# Patient Record
Sex: Female | Born: 1995 | Hispanic: Yes | Marital: Single | Smoking: Former smoker
Health system: Southern US, Community
[De-identification: ages and names within clinical notes are randomized; demographics above are authoritative.]

---

## 2015-04-13 ENCOUNTER — Encounter: Payer: Self-pay | Admitting: Emergency Medicine

## 2015-04-13 ENCOUNTER — Emergency Department: Payer: Medicaid Other

## 2015-04-13 ENCOUNTER — Emergency Department
Admission: EM | Admit: 2015-04-13 | Discharge: 2015-04-13 | Disposition: A | Discharge status: Home / Self Care | Payer: Medicaid Other | Attending: Emergency Medicine | Admitting: Emergency Medicine

## 2015-04-13 DIAGNOSIS — Z87891 Personal history of nicotine dependence: Secondary | ICD-10-CM | POA: Insufficient documentation

## 2015-04-13 DIAGNOSIS — Z880 Allergy status to penicillin: Secondary | ICD-10-CM | POA: Diagnosis not present

## 2015-04-13 DIAGNOSIS — J209 Acute bronchitis, unspecified: Secondary | ICD-10-CM

## 2015-04-13 DIAGNOSIS — R05 Cough: Secondary | ICD-10-CM | POA: Diagnosis present

## 2015-04-13 MED ORDER — PREDNISONE 10 MG PO TABS: 50.0000 mg | ORAL_TABLET | Freq: Every day | ORAL | Status: AC

## 2015-04-13 MED ORDER — ALBUTEROL SULFATE (2.5 MG/3ML) 0.083% IN NEBU
2.5000 mg | INHALATION_SOLUTION | Freq: Once | RESPIRATORY_TRACT | Status: AC
  Administered 2015-04-13: 2.5 mg via RESPIRATORY_TRACT
  Filled 2015-04-13: qty 3

## 2015-04-13 MED ORDER — ALBUTEROL SULFATE HFA 108 (90 BASE) MCG/ACT IN AERS: 2.0000 | INHALATION_SPRAY | Freq: Four times a day (QID) | RESPIRATORY_TRACT | Status: AC | PRN

## 2015-04-13 MED ORDER — GUAIFENESIN-CODEINE 100-10 MG/5ML PO SOLN: 10.0000 mL | Freq: Three times a day (TID) | ORAL | Status: AC | PRN

## 2015-04-13 MED ORDER — IPRATROPIUM-ALBUTEROL 0.5-2.5 (3) MG/3ML IN SOLN
3.0000 mL | Freq: Once | RESPIRATORY_TRACT | Status: AC
  Administered 2015-04-13: 3 mL via RESPIRATORY_TRACT

## 2015-04-13 MED ORDER — IPRATROPIUM-ALBUTEROL 0.5-2.5 (3) MG/3ML IN SOLN
RESPIRATORY_TRACT | Status: AC
  Filled 2015-04-13: qty 3

## 2015-04-13 NOTE — ED Provider Notes (Signed)
[facility] Emergency Department Provider Note ____________________________________________  Time seen: Approximately 12:17 PM  I have reviewed the triage vital signs and the nursing notes.   HISTORY  Chief Complaint Cough   HPI Valerie Joseph is a 19 y.o. female who presents to the emergency department for evaluation of cough x 4 days. She states that she has been wheezing and had chest congestion as well. No known fever. No OTC meds relieve symptoms.  History reviewed. No pertinent past medical history.  There are no active problems to display for this patient.   History reviewed. No pertinent past surgical history.  Current Outpatient Rx  Name  Route  Sig  Dispense  Refill  . albuterol (PROVENTIL HFA;VENTOLIN HFA) 108 (90 BASE) MCG/ACT inhaler   Inhalation   Inhale 2 puffs into the lungs every 6 (six) hours as needed for wheezing or shortness of breath.   1 Inhaler   2   . guaiFENesin-codeine 100-10 MG/5ML syrup   Oral   Take 10 mLs by mouth 3 (three) times daily as needed.   120 mL   0   . predniSONE (DELTASONE) 10 MG tablet   Oral   Take 5 tablets (50 mg total) by mouth daily.   25 tablet   0     Allergies Penicillins  No family history on file.  Social History Social History  Substance Use Topics  . Smoking status: Former Games developer  . Smokeless tobacco: None  . Alcohol Use: No    Review of Systems Constitutional: No fever/chills Eyes: No visual changes. ENT: No sore throat. Cardiovascular: Denies chest pain. Respiratory: Positive for shortness of breath. Positive for cough. Gastrointestinal: Negative for abdominal pain. Negative for nausea,  Negative for vomiting.  Negative for diarrhea.  Genitourinary: Negative for dysuria. Musculoskeletal: Negative for body aches Skin: Negative for rash. Neurological: Negative for headaches, Negative for focal weakness or numbness.  10-point ROS otherwise  negative.  ____________________________________________   PHYSICAL EXAM:  VITAL SIGNS: ED Triage Vitals  Enc Vitals Group     BP 04/13/15 1154 135/75 mmHg     Pulse Rate 04/13/15 1154 96     Resp 04/13/15 1154 20     Temp 04/13/15 1154 97.7 F (36.5 C)     Temp Source 04/13/15 1154 Oral     SpO2 04/13/15 1154 96 %     Weight 04/13/15 1154 265 lb (120.203 kg)     Height 04/13/15 1154  (1.575 m)     Head Cir --      Peak Flow --      Pain Score 04/13/15 1155 7     Pain Loc --      Pain Edu? --      Excl. in GC? --     Constitutional: Alert and oriented. Well appearing and in no acute distress. Eyes: Conjunctivae are normal. PERRL. EOMI. Ears: Normal TM Head: Atraumatic. Nose: No congestion/rhinnorhea. Mouth/Throat: Mucous membranes are moist.  Oropharynx non-erythematous. Neck: No stridor.  Lymphatic: No cervical lymphadenopathy. Cardiovascular: Normal rate, regular rhythm. Grossly normal heart sounds.  Good peripheral circulation. Respiratory: Normal respiratory effort.  No retractions. Expiratory wheeze throughout. Gastrointestinal: Soft and nontender. No distention. No abdominal bruits. No CVA tenderness. Musculoskeletal: No joint pain reported. Neurologic:  Normal speech and language. No gross focal neurologic deficits are appreciated. Speech is normal. No gait instability. Skin:  Skin is warm, dry and intact. No rash noted. Psychiatric: Mood and affect are normal. Speech and behavior  are normal.  ____________________________________________   LABS (all labs ordered are listed, but only abnormal results are displayed)  Labs Reviewed - No data to display ____________________________________________  EKG   ____________________________________________  RADIOLOGY  No acute cardiopulmonary process per radiology. ____________________________________________   PROCEDURES  Procedure(s) performed: None  Critical Care performed:  No  ____________________________________________   INITIAL IMPRESSION / ASSESSMENT AND PLAN / ED COURSE  Pertinent labs & imaging results that were available during my care of the patient were reviewed by me and considered in my medical decision making (see chart for details).   Duoneb ordered. Will reassess.  ----------------------------------------- 1:23 PM on 04/13/2015 -----------------------------------------  Better air movement. Will order albuterol treatment prior to discharge. Patient was advised to [facility] for symptoms that are not improving over the week. She is to return to the ER for symptoms that change or worsen if unable to schedule an appointment. ____________________________________________   FINAL CLINICAL IMPRESSION(S) / ED DIAGNOSES  Final diagnoses:  Acute bronchitis, unspecified organism       Chinita PesterCari B Wilburn Keir, FNP 04/13/15 1325  Gayla DossEryka A Gayle, MD 04/13/15 1534

## 2015-04-13 NOTE — ED Notes (Signed)
Cough x 4 days, chest congestion

## 2015-04-13 NOTE — ED Notes (Signed)
NAD noted at time of D/C. Pt denies questions or concerns. Pt ambulatory to the lobby at this time.  

## 2015-04-13 NOTE — Discharge Instructions (Signed)
Bronquitis aguda  (Acute Bronchitis)  La bronquitis es una inflamación de las vías respiratorias que se extienden desde la tráquea hasta los pulmones (bronquios). La inflamación produce la formación de mucosidad. Esto produce tos, que es el síntoma más frecuente de la bronquitis.   Cuando la bronquitis es aguda, generalmente comienza de manera súbita y desaparece luego de un par de semanas. El hábito de fumar, las alergias y el asma pueden empeorar la bronquitis. Los episodios repetidos de bronquitis pueden causar más problemas pulmonares.   CAUSAS  La causa más frecuente de bronquitis aguda es el mismo virus que produce el resfrío. El virus puede propagarse de una persona a la otra (contagioso) a través de la tos y los estornudos, y al tocar objetos contaminados.  SIGNOS Y SÍNTOMAS   · Tos.  · Fiebre.  · Tos con mucosidad.  · Dolores en el cuerpo.  · Congestión en el pecho.  · Escalofríos.  · Falta de aire.  · Dolor de garganta.  DIAGNÓSTICO   La bronquitis aguda en general se diagnostica con un examen físico. El médico también le hará preguntas sobre su historia clínica. En algunos casos se indican otros estudios, como radiografías, para descartar otras enfermedades.   TRATAMIENTO   La bronquitis aguda generalmente desaparece en un par de semanas. Con frecuencia, no es necesario realizar un tratamiento. Los medicamentos se indican para aliviar la fiebre o la tos. Generalmente, no es necesario el uso de antibióticos, pero pueden recetarse en ciertas ocasiones. En algunos casos, se recomienda el uso de un inhalador para mejorar la falta de aire y controlar la tos. Un vaporizador de aire frío podrá ayudarlo a disolver las secreciones bronquiales y facilitar su eliminación.   INSTRUCCIONES PARA EL CUIDADO EN EL HOGAR   · Descanse lo suficiente.  · Beba líquidos en abundancia para mantener la orina de color claro o amarillo pálido (excepto que padezca una enfermedad que requiera la restricción de líquidos). El aumento  de líquidos puede ayudar a que las secreciones respiratorias (esputo) sean menos espesas y a reducir la congestión del pecho, y evitará la deshidratación.  · Tome los medicamentos solamente como se lo haya indicado el médico.  · Si le recetaron antibióticos, asegúrese de terminarlos, incluso si comienza a sentirse mejor.  · Evite fumar o aspirar el humo de otros fumadores. La exposición al humo del cigarrillo o a irritantes químicos hará que la bronquitis empeore. Si fuma, considere el uso de goma de mascar o la aplicación de parches en la piel que contengan nicotina para aliviar los síntomas de abstinencia. Si deja de fumar, sus pulmones se curarán más rápido.  · Reduzca la probabilidad de otro episodio de bronquitis aguda lavando sus manos con frecuencia, evitando a las personas que tengan síntomas y tratando de no tocarse las manos con la boca, la nariz o los ojos.  · Concurra a todas las visitas de control como se lo haya indicado el médico.  SOLICITE ATENCIÓN MÉDICA SI:  Los síntomas no mejoran después de una semana de tratamiento.   SOLICITE ATENCIÓN MÉDICA DE INMEDIATO SI:  · Comienza a tener fiebre o escalofríos cada vez más intensos.  · Siente dolor en el pecho.  · Le falta el aire de manera preocupante.  · La flema tiene sangre.  · Se deshidrata.  · Se desmaya o siente que va a desmayarse de forma repetida.  · Tiene vómitos que se repiten.  · Tiene un dolor de cabeza intenso.  ASEGÚRESE DE QUE:   ·   Comprende estas instrucciones.  · Controlará su afección.  · Recibirá ayuda de inmediato si no mejora o si empeora.     Esta información no tiene como fin reemplazar el consejo del médico. Asegúrese de hacerle al médico cualquier pregunta que tenga.     Document Released: 04/13/2005 Document Revised: 05/04/2014  Elsevier Interactive Patient Education ©2016 [employer]

## 2017-07-25 IMAGING — CR DG CHEST 2V
1 series · 2 of 2 positions shown · non-contrast
Comparison: None.

CLINICAL DATA: Patient with coughing congestion for 4 days.
Shortness of breath.

EXAM:
CHEST  2 VIEW

[Series 1: dg chest 2 view · 0.14mm/px · 2 of 2 slices shown]
[im 1/2]
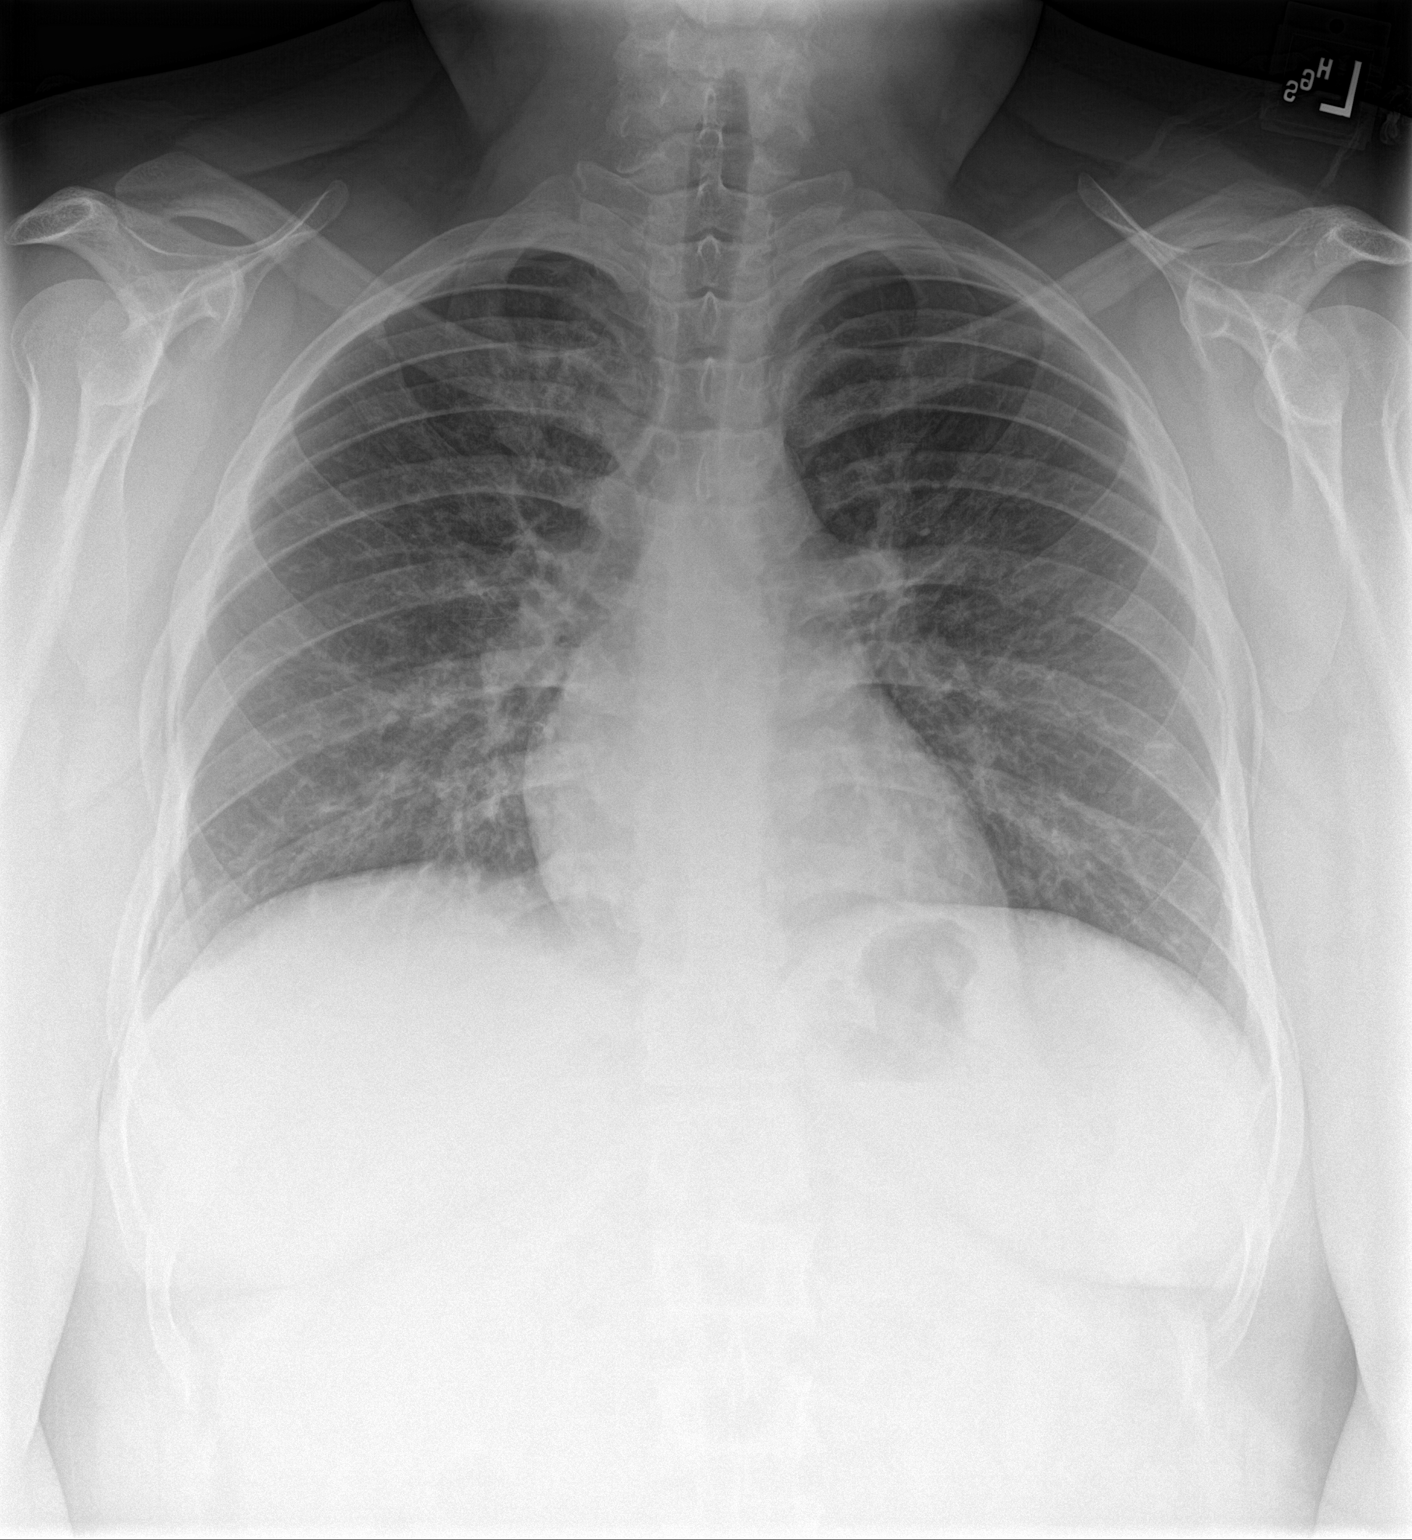
[im 2/2]
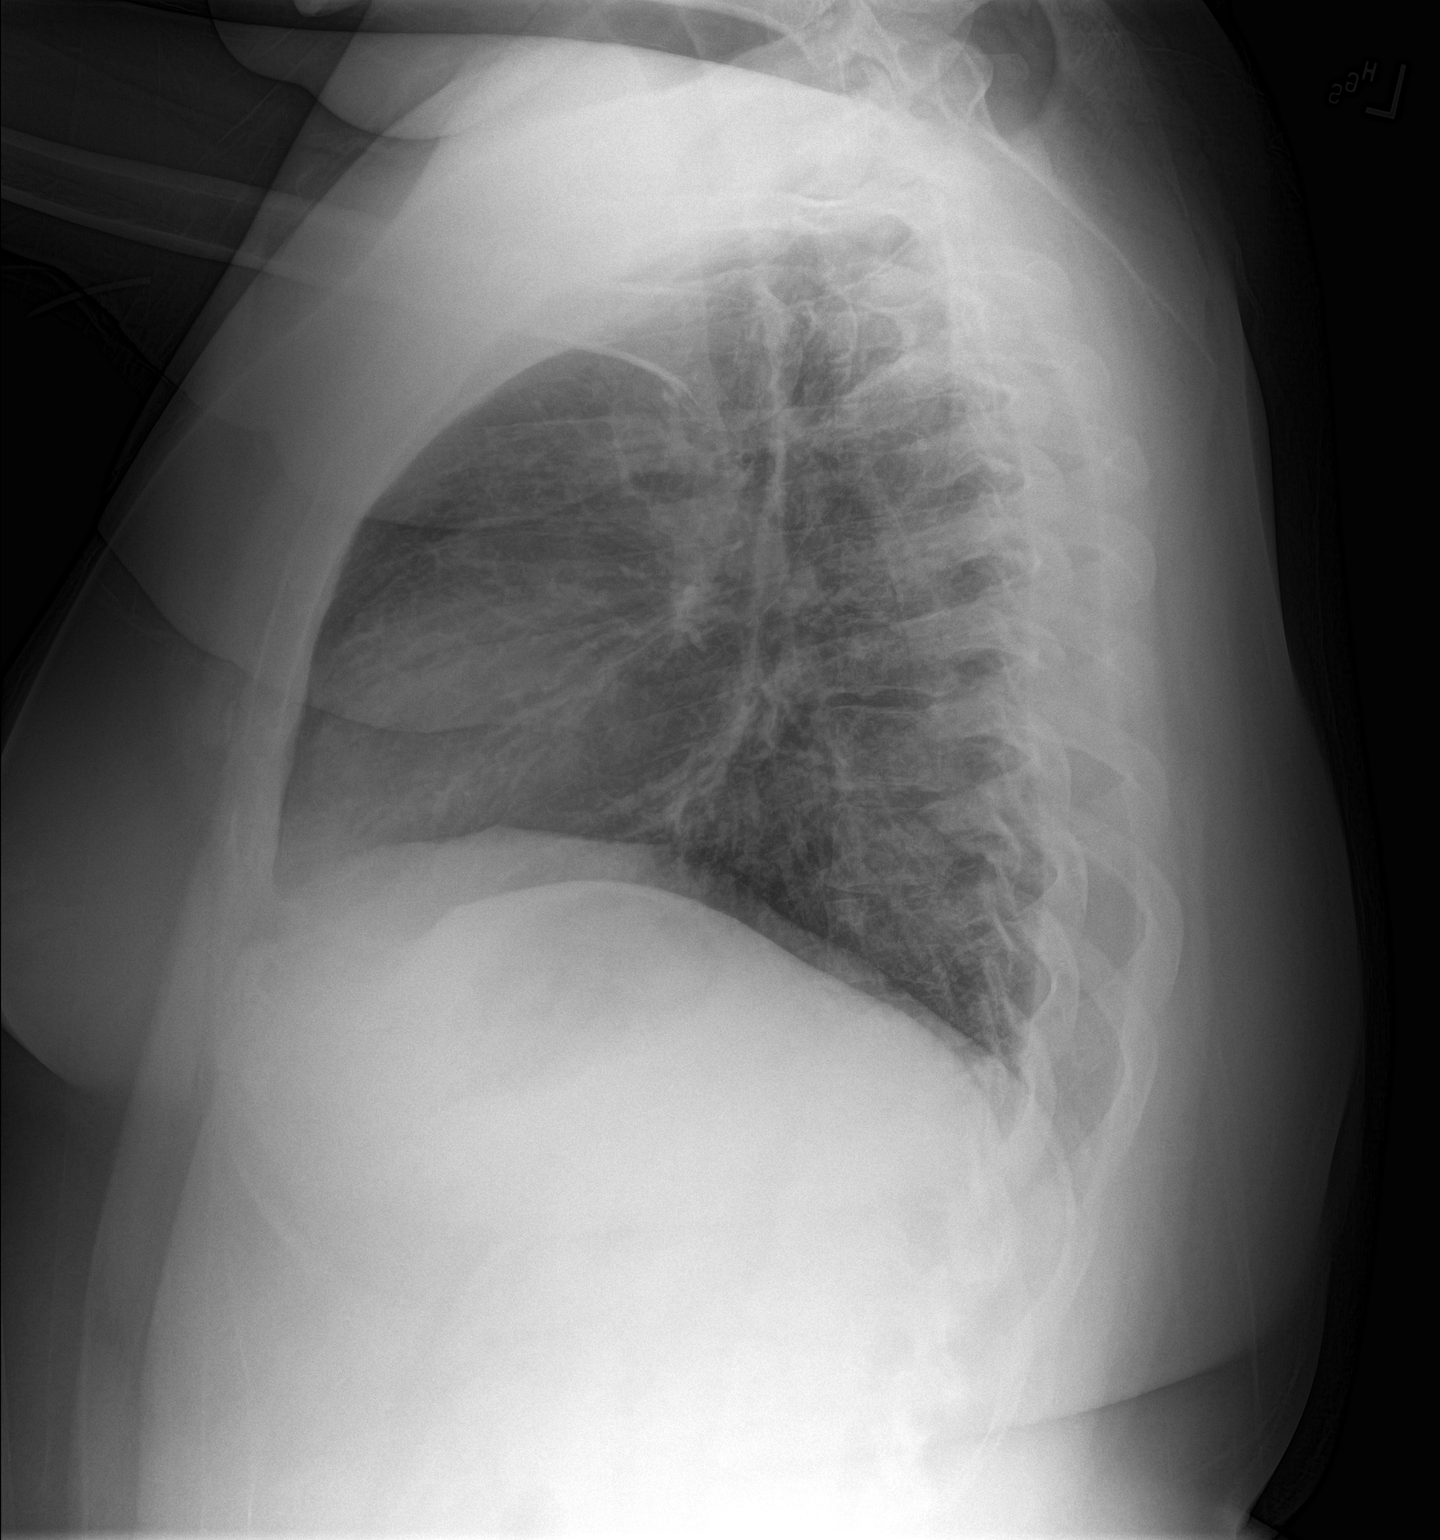

[2 of 2 positions shown; findings below may reference images not displayed]

FINDINGS: Normal cardiac and mediastinal contours. No consolidative pulmonary
opacities. No pleural effusion or pneumothorax.
IMPRESSION: No acute cardiopulmonary process.
